# Patient Record
Sex: Female | Born: 1965 | Race: White | Hispanic: No | Marital: Single
Health system: Southern US, Community
[De-identification: ages and names within clinical notes are randomized; demographics above are authoritative.]

---

## 1998-09-12 ENCOUNTER — Other Ambulatory Visit: Admission: RE | Admit: 1998-09-12 | Discharge: 1998-09-12 | Payer: Self-pay | Admitting: Gynecology

## 1999-07-22 ENCOUNTER — Other Ambulatory Visit: Admission: RE | Admit: 1999-07-22 | Discharge: 1999-07-22 | Payer: Self-pay | Admitting: General Practice

## 2001-09-06 ENCOUNTER — Encounter: Payer: Self-pay | Admitting: Gynecology

## 2001-09-06 ENCOUNTER — Encounter: Admission: RE | Admit: 2001-09-06 | Discharge: 2001-09-06 | Payer: Self-pay | Admitting: Gynecology

## 2002-02-21 ENCOUNTER — Encounter: Payer: Self-pay | Admitting: Gynecology

## 2002-02-21 ENCOUNTER — Encounter: Admission: RE | Admit: 2002-02-21 | Discharge: 2002-02-21 | Payer: Self-pay | Admitting: Gynecology

## 2002-03-08 ENCOUNTER — Other Ambulatory Visit: Admission: RE | Admit: 2002-03-08 | Discharge: 2002-03-08 | Payer: Self-pay | Admitting: Gynecology

## 2003-03-22 ENCOUNTER — Other Ambulatory Visit: Admission: RE | Admit: 2003-03-22 | Discharge: 2003-03-22 | Payer: Self-pay | Admitting: Gynecology

## 2004-04-08 ENCOUNTER — Other Ambulatory Visit: Admission: RE | Admit: 2004-04-08 | Discharge: 2004-04-08 | Payer: Self-pay | Admitting: Gynecology

## 2004-08-12 ENCOUNTER — Encounter: Admission: RE | Admit: 2004-08-12 | Discharge: 2004-08-12 | Payer: Self-pay | Admitting: Surgery

## 2004-10-23 ENCOUNTER — Ambulatory Visit: Payer: Self-pay | Admitting: Oncology

## 2008-07-12 ENCOUNTER — Other Ambulatory Visit: Admission: RE | Admit: 2008-07-12 | Discharge: 2008-07-12 | Payer: Self-pay | Admitting: Family Medicine

## 2008-07-17 ENCOUNTER — Ambulatory Visit (HOSPITAL_COMMUNITY): Admission: RE | Admit: 2008-07-17 | Discharge: 2008-07-17 | Payer: Self-pay | Admitting: Family Medicine

## 2008-07-19 ENCOUNTER — Encounter: Admission: RE | Admit: 2008-07-19 | Discharge: 2008-07-19 | Payer: Self-pay | Admitting: Family Medicine

## 2009-03-09 ENCOUNTER — Emergency Department (HOSPITAL_COMMUNITY): Admission: EM | Admit: 2009-03-09 | Discharge: 2009-03-09 | Payer: Self-pay | Admitting: Emergency Medicine

## 2010-09-26 LAB — POCT URINALYSIS DIP (DEVICE)
Ketones, ur: NEGATIVE mg/dL
Nitrite: POSITIVE — AB
Protein, ur: 30 mg/dL — AB
pH: 5 (ref 5.0–8.0)

## 2011-02-06 ENCOUNTER — Other Ambulatory Visit: Payer: Self-pay | Admitting: Family Medicine

## 2011-02-06 DIAGNOSIS — N63 Unspecified lump in unspecified breast: Secondary | ICD-10-CM

## 2011-02-13 ENCOUNTER — Ambulatory Visit
Admission: RE | Admit: 2011-02-13 | Discharge: 2011-02-13 | Disposition: A | Discharge status: Home / Self Care | Payer: Private Health Insurance - Indemnity | Source: Ambulatory Visit | Attending: Family Medicine | Admitting: Family Medicine

## 2011-02-13 DIAGNOSIS — N63 Unspecified lump in unspecified breast: Secondary | ICD-10-CM

## 2011-08-18 ENCOUNTER — Other Ambulatory Visit (HOSPITAL_COMMUNITY)
Admission: RE | Admit: 2011-08-18 | Discharge: 2011-08-18 | Disposition: A | Discharge status: Home / Self Care | Payer: Private Health Insurance - Indemnity | Source: Ambulatory Visit | Attending: Family Medicine | Admitting: Family Medicine

## 2011-08-18 DIAGNOSIS — Z124 Encounter for screening for malignant neoplasm of cervix: Secondary | ICD-10-CM | POA: Insufficient documentation

## 2013-01-22 ENCOUNTER — Encounter (HOSPITAL_COMMUNITY): Payer: Self-pay | Admitting: Emergency Medicine

## 2013-01-22 ENCOUNTER — Emergency Department (HOSPITAL_COMMUNITY)
Admission: EM | Admit: 2013-01-22 | Discharge: 2013-01-22 | Disposition: A | Discharge status: Home / Self Care | Payer: BC Managed Care – PPO | Source: Home / Self Care

## 2013-01-22 DIAGNOSIS — R319 Hematuria, unspecified: Secondary | ICD-10-CM

## 2013-01-22 DIAGNOSIS — N390 Urinary tract infection, site not specified: Secondary | ICD-10-CM

## 2013-01-22 LAB — POCT PREGNANCY, URINE: Preg Test, Ur: NEGATIVE

## 2013-01-22 LAB — POCT URINALYSIS DIP (DEVICE)
Glucose, UA: NEGATIVE mg/dL
Nitrite: NEGATIVE
Specific Gravity, Urine: 1.015 (ref 1.005–1.030)
Urobilinogen, UA: 0.2 mg/dL (ref 0.0–1.0)

## 2013-01-22 MED ORDER — CIPROFLOXACIN HCL 500 MG PO TABS: 500.0000 mg | ORAL_TABLET | Freq: Two times a day (BID) | ORAL | Status: AC

## 2013-01-22 NOTE — ED Provider Notes (Signed)
  CSN: [id number]     Arrival date & time 01/22/13  0912 History     First MD Initiated Contact with Patient 01/22/13 [phone]     Chief Complaint  Patient presents with  . Hematuria   (Consider location/radiation/quality/duration/timing/severity/associated sxs/prior Treatment) HPI  47 yo wf presents the hematuria.  Started this morning.  Did have a small clot.  Some lower abd cramping when voiding this morning.  Denies fever, chills, nausea, vomiting, chest pain, sob, bowel issues, dysuria, vaginal discharge.   History reviewed. No pertinent past medical history. No past surgical history on file. No family history on file. History  Substance Use Topics  . Smoking status: Not on file  . Smokeless tobacco: Not on file  . Alcohol Use: Not on file   OB History   Grav Para Term Preterm Abortions TAB SAB Ect Mult Living                 Review of Systems  Constitutional: Negative.   HENT: Negative.   Eyes: Negative.   Respiratory: Negative.   Cardiovascular: Negative.   Gastrointestinal: Negative.   Endocrine: Negative.   Genitourinary: Positive for hematuria. Negative for dysuria, frequency, flank pain, vaginal bleeding, vaginal discharge, genital sores and pelvic pain.  Musculoskeletal: Negative.   Skin: Negative.   Neurological: Negative.   Psychiatric/Behavioral: Negative.     Allergies  Gluten meal and Latex  Home Medications   Current Outpatient Rx  Name  Route  Sig  Dispense  Refill  . ciprofloxacin (CIPRO) 500 MG tablet   Oral   Take 1 tablet (500 mg total) by mouth 2 (two) times daily.   20 tablet   0    BP 116/74  Pulse 72  Temp(Src) 98.5 F (36.9 C) (Oral)  Resp 15  SpO2 100%  LMP 01/21/2013 Physical Exam  Constitutional: She is oriented to person, place, and time. She appears well-developed and well-nourished.  HENT:  Head: Normocephalic and atraumatic.  Eyes: EOM are normal. Pupils are equal, round, and reactive to light.  Cardiovascular: Normal  rate and regular rhythm.   Pulmonary/Chest: Effort normal and breath sounds normal.  Abdominal: Soft. Bowel sounds are normal. She exhibits no distension and no mass. There is no tenderness. There is no rebound and no guarding.  Musculoskeletal: Normal range of motion.  Neurological: She is alert and oriented to person, place, and time.  Skin: Skin is warm and dry.  Psychiatric: She has a normal mood and affect.    ED Course   Procedures (including critical care time)  Labs Reviewed  POCT URINALYSIS DIP (DEVICE) - Abnormal; Notable for the following:    Hgb urine dipstick LARGE (*)    Leukocytes, UA SMALL (*)    All other components within normal limits  URINE CULTURE  POCT PREGNANCY, URINE   No results found. 1. UTI (urinary tract infection)   2. Hematuria     MDM  Given script for cipro.  If symptoms not resolved within 3-5 days she will return.  Come in sooner if need.  All questions answered.    Meds ordered this encounter  Medications  . ciprofloxacin (CIPRO) 500 MG tablet    Sig: Take 1 tablet (500 mg total) by mouth 2 (two) times daily.    Dispense:  20 tablet    Refill:  0    Zonia Kief, PA-C 01/22/13 1009

## 2013-01-22 NOTE — ED Provider Notes (Signed)
Medical screening examination/treatment/procedure(s) were performed by non-physician practitioner and as supervising physician I was immediately available for consultation/collaboration.  Leslee Home, M.D.   Reuben Likes, MD 01/22/13 [phone]

## 2013-01-22 NOTE — ED Notes (Signed)
States notice blood in urine this morning.  Denies pain when urinating and abd pain but does have pressure when urinating.  Patient has had these symptoms before but has been a while.

## 2013-01-23 LAB — URINE CULTURE

## 2013-01-29 ENCOUNTER — Telehealth (HOSPITAL_COMMUNITY): Payer: Self-pay | Admitting: *Deleted

## 2013-01-29 NOTE — ED Notes (Signed)
Urine culture: 8,000 colonies insignificant growth.  Lestine Mount PA sent me a message to call pt. and tell her to f/u with her PCP if still having symptoms. I called but pt. has no voicemail. Unable to leave a message to call. Vassie Moselle 01/29/2013

## 2013-02-12 ENCOUNTER — Telehealth (HOSPITAL_COMMUNITY): Payer: Self-pay | Admitting: *Deleted

## 2013-02-12 NOTE — ED Notes (Signed)
Urine culture: 8,000 colonies insignificant growth.  Zackery Barefoot PA sent me a message to tell pt. to f/u with PCP if still having symptoms. I called pt. and she said she does not have any symptoms.  She said she took all the Cipro and it cleared up. Vassie Moselle 02/12/2013

## 2020-02-02 ENCOUNTER — Encounter: Payer: Self-pay | Admitting: Genetic Counselor

## 2020-04-20 ENCOUNTER — Ambulatory Visit: Payer: Self-pay | Attending: Internal Medicine

## 2020-04-20 DIAGNOSIS — Z23 Encounter for immunization: Secondary | ICD-10-CM

## 2020-04-20 NOTE — Progress Notes (Signed)
   Covid-19 Vaccination Clinic  Name:  Renai Lopata    MRN: [id number] DOB: [birth year]  04/20/2020  Ms. Scarber was observed post Covid-19 immunization for 15 minutes without incident. She was provided with Vaccine Information Sheet and instruction to access the V-Safe system.   Ms. Calix was instructed to call 911 with any severe reactions post vaccine: Marland Kitchen Difficulty breathing  . Swelling of face and throat  . A fast heartbeat  . A bad rash all over body  . Dizziness and weakness

## 2020-11-14 ENCOUNTER — Other Ambulatory Visit (HOSPITAL_BASED_OUTPATIENT_CLINIC_OR_DEPARTMENT_OTHER): Payer: Self-pay

## 2020-11-14 ENCOUNTER — Ambulatory Visit: Payer: Self-pay | Attending: Internal Medicine

## 2020-11-14 ENCOUNTER — Other Ambulatory Visit: Payer: Self-pay

## 2020-11-14 DIAGNOSIS — Z23 Encounter for immunization: Secondary | ICD-10-CM

## 2020-11-14 MED ORDER — COVID-19 MRNA VACC (MODERNA) 100 MCG/0.5ML IM SUSP
INTRAMUSCULAR | 0 refills | Status: AC
  Filled 2020-11-14: qty 0.25, 1d supply, fill #0

## 2020-11-14 NOTE — Progress Notes (Signed)
   Covid-19 Vaccination Clinic  Name:  Theresa Medina    MRN: [id number] DOB: 08/08/1965  11/14/2020  [name] was observed post Covid-19 immunization for 15 minutes without incident. She was provided with Vaccine Information Sheet and instruction to access the V-Safe system.   [name] was instructed to call 911 with any severe reactions post vaccine: Marland Kitchen Difficulty breathing  . Swelling of face and throat  . A fast heartbeat  . A bad rash all over body  . Dizziness and weakness   Immunizations Administered    Name Date Dose VIS Date Route   Moderna Covid-19 Booster Vaccine 11/14/2020 12:50 PM 0.25 mL 04/10/2020 Intramuscular   Manufacturer: Moderna   Lot: [id number]   NDC: 56314-970-26

## 2021-08-18 ENCOUNTER — Other Ambulatory Visit: Payer: Self-pay | Admitting: Family Medicine

## 2021-08-18 DIAGNOSIS — E2839 Other primary ovarian failure: Secondary | ICD-10-CM

## 2021-08-29 ENCOUNTER — Other Ambulatory Visit: Payer: Self-pay | Admitting: Family Medicine

## 2021-08-29 DIAGNOSIS — Z1231 Encounter for screening mammogram for malignant neoplasm of breast: Secondary | ICD-10-CM

## 2021-10-01 ENCOUNTER — Ambulatory Visit
Admission: RE | Admit: 2021-10-01 | Discharge: 2021-10-01 | Disposition: A | Discharge status: Home / Self Care | Payer: Managed Care, Other (non HMO) | Source: Ambulatory Visit | Attending: Family Medicine | Admitting: Family Medicine

## 2021-10-01 DIAGNOSIS — Z1231 Encounter for screening mammogram for malignant neoplasm of breast: Secondary | ICD-10-CM

## 2023-07-08 IMAGING — MG MM DIGITAL SCREENING BILAT W/ TOMO AND CAD
8 series · 9 of 24 positions shown · non-contrast
Comparison: Previous exam(s).

CLINICAL DATA: Screening.

EXAM:
DIGITAL SCREENING BILATERAL MAMMOGRAM WITH TOMOSYNTHESIS AND CAD
TECHNIQUE: Bilateral screening digital craniocaudal and mediolateral oblique
mammograms were obtained. Bilateral screening digital breast
tomosynthesis was performed. The images were evaluated with
computer-aided detection.

[L MLO synth-2D]
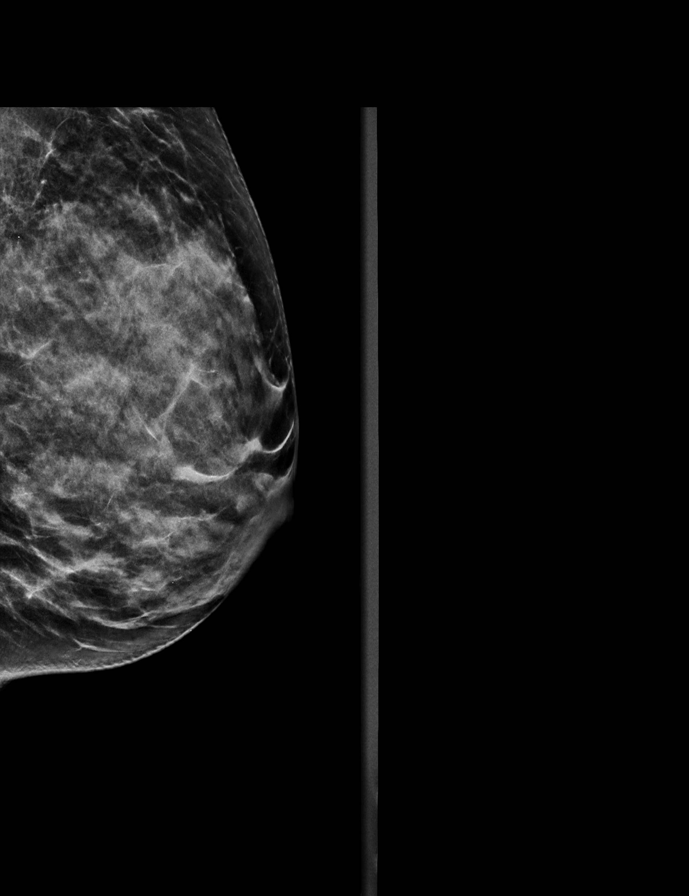

[R MLO synth-2D]
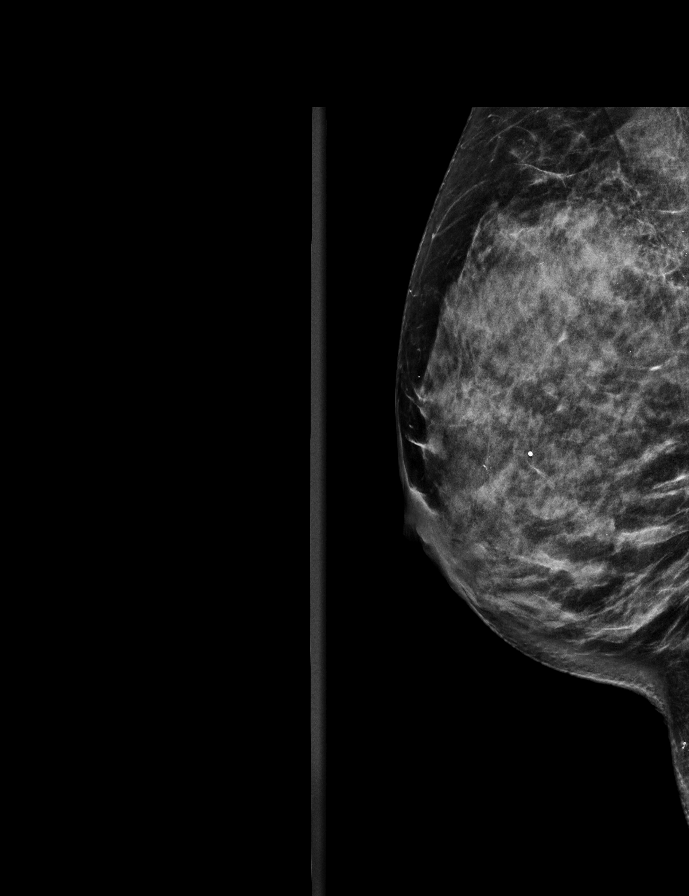

[L CC synth-2D]
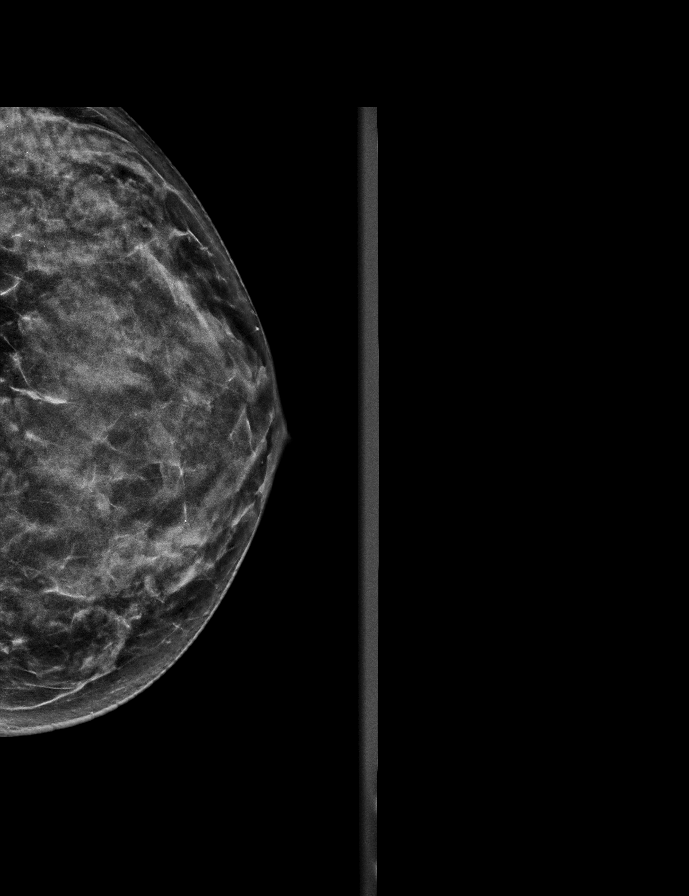

[R CC synth-2D]
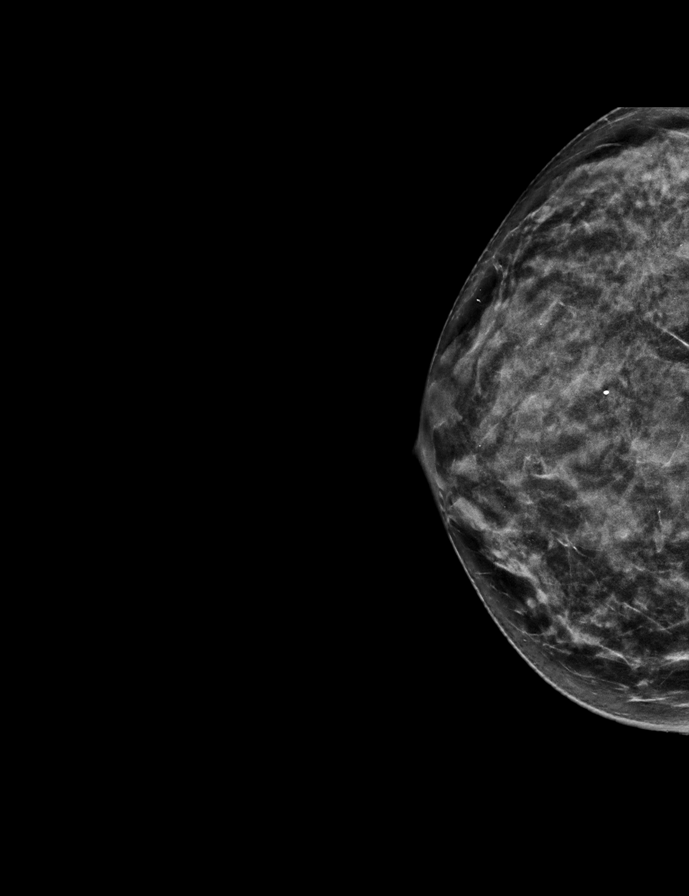

[L CC tomo · 2 of 53 frames shown]
[frame 18/53]
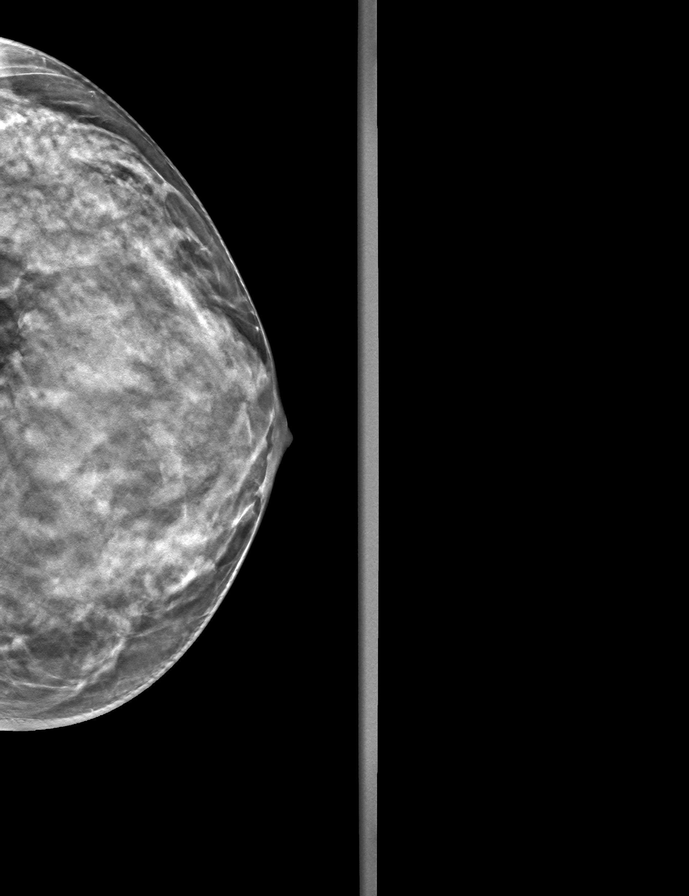
[frame 27/53]
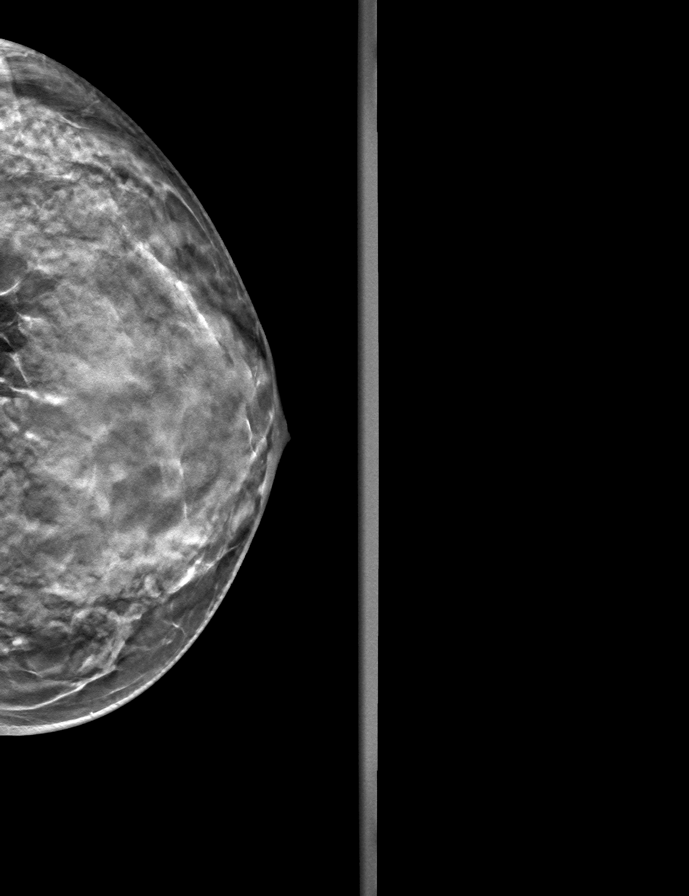

[L MLO tomo · tomo slice 28/55.0]
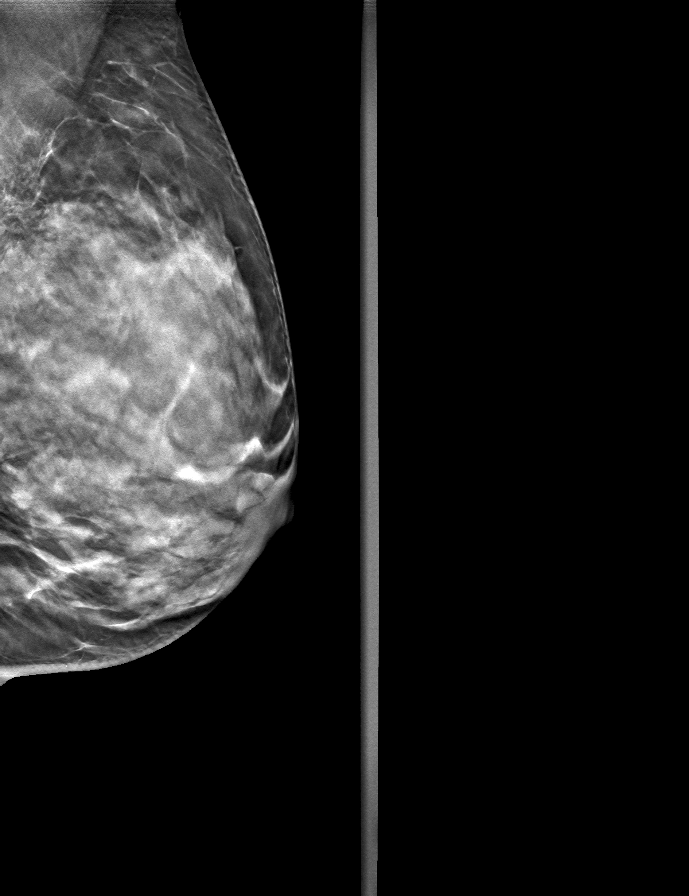

[R MLO tomo · tomo slice 29/57.0]
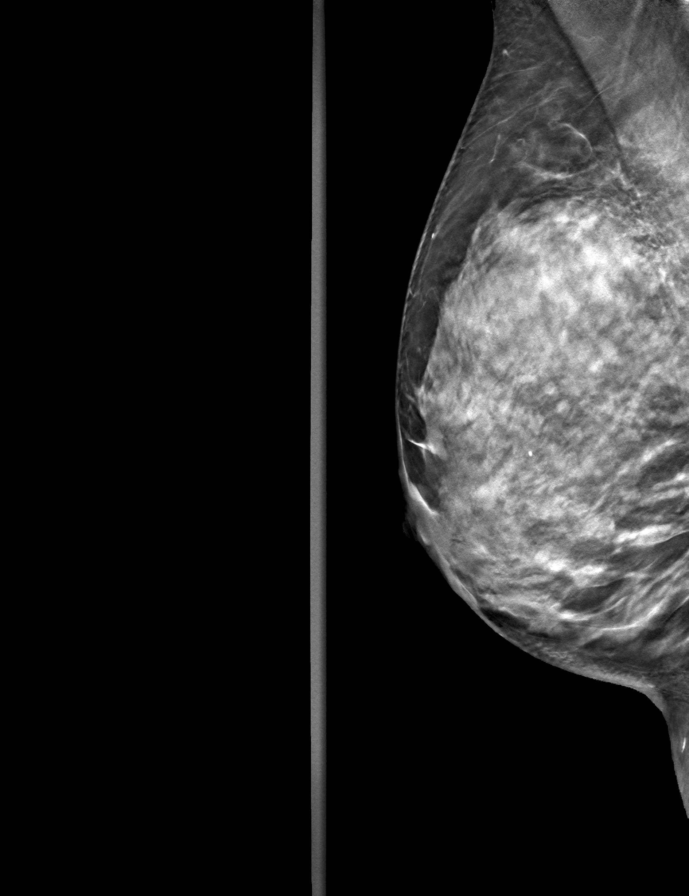

[R CC tomo · tomo slice 28/55.0]
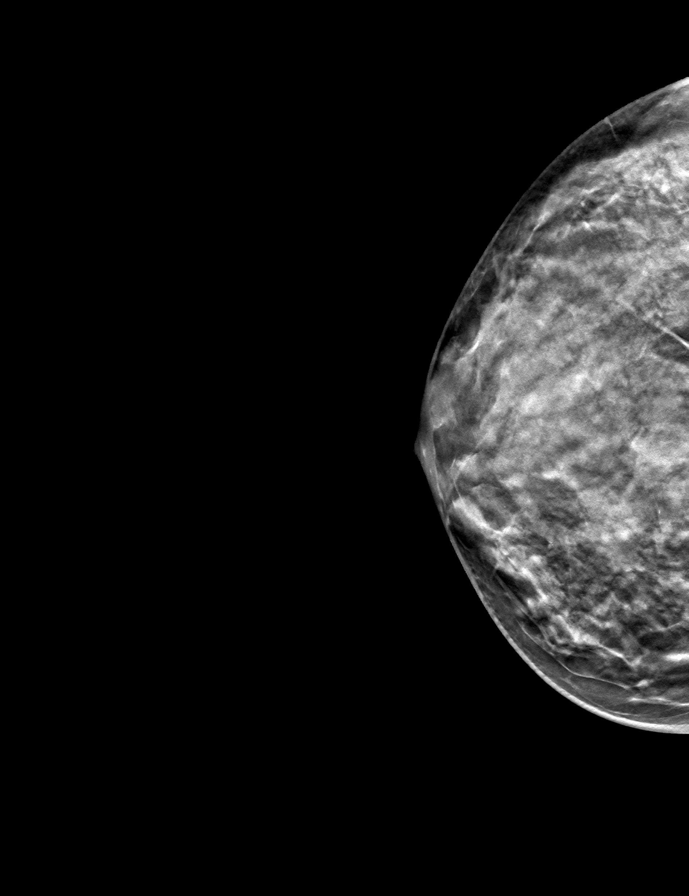

[9 of 24 positions shown; findings below may reference images not displayed]

ACR Breast Density Category d: The breast tissue is extremely dense,
which lowers the sensitivity of mammography
FINDINGS: There are no findings suspicious for malignancy.
IMPRESSION: No mammographic evidence of malignancy. A result letter of this
screening mammogram will be mailed directly to the patient.

RECOMMENDATION:
Screening mammogram in one year. (Code:TA-[id number])

BI-RADS CATEGORY  1: Negative.
